# Patient Record
Sex: Female | Born: 2005 | Race: White | Hispanic: No | Marital: Single | Smoking: Never smoker
Health system: Southern US, Community
[De-identification: ages and names within clinical notes are randomized; demographics above are authoritative.]

---
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
## 2023-01-05 ENCOUNTER — Encounter: Payer: Self-pay | Admitting: Emergency Medicine

## 2023-01-05 ENCOUNTER — Ambulatory Visit
Admission: EM | Admit: 2023-01-05 | Discharge: 2023-01-05 | Disposition: A | Discharge status: Home / Self Care | Payer: Medicaid Other | Attending: Nurse Practitioner | Admitting: Nurse Practitioner

## 2023-01-05 DIAGNOSIS — R21 Rash and other nonspecific skin eruption: Secondary | ICD-10-CM | POA: Diagnosis not present

## 2023-01-05 MED ORDER — TRIAMCINOLONE ACETONIDE 0.1 % EX CREA: 1.0000 | TOPICAL_CREAM | Freq: Two times a day (BID) | CUTANEOUS | 0 refills | Status: DC

## 2023-01-05 MED ORDER — DEXAMETHASONE SODIUM PHOSPHATE 10 MG/ML IJ SOLN
10.0000 mg | INTRAMUSCULAR | Status: AC
  Administered 2023-01-05: 10 mg via INTRAMUSCULAR

## 2023-01-05 MED ORDER — PREDNISONE 20 MG PO TABS: 40.0000 mg | ORAL_TABLET | Freq: Every day | ORAL | 0 refills | Status: AC

## 2023-01-05 NOTE — ED Triage Notes (Signed)
Rash on upper lip and chest since June 29th.  Now has areas on legs, back and fingers. States areas itch.  Has tried hydrocortisone cream with little relief.

## 2023-01-05 NOTE — Discharge Instructions (Addendum)
You were given an injection of Decadron 10 mg today. Take medication as prescribed.  Begin taking the prednisone on 01/06/2023. May take Zyrtec during the daytime and Benadryl at bedtime to help with itching. Avoid hot baths or showers while symptoms persist.  Recommend taking lukewarm baths. May apply cool cloths to the area to help with itching or discomfort. Avoid scratching, rubbing, or manipulating the areas while symptoms persist. Recommend Aveeno Colloidal Oatmeal bath to use to help with drying and itching. Follow up if symptoms do not improve.

## 2023-01-05 NOTE — ED Provider Notes (Signed)
[facility]    CSN: [id number] Arrival date & time: 01/05/23  1344      History   Chief Complaint No chief complaint on file.   HPI Whitney Cross is a 17 y.o. female.   HPI  History reviewed. No pertinent past medical history.  Patient Active Problem List   Diagnosis Date Noted   Acne vulgaris 10/21/2021   Orthostatic hypotension 07/14/2021   Seborrheic dermatitis of scalp 07/14/2021    History reviewed. No pertinent surgical history.  OB History   No obstetric history on file.      Home Medications    Prior to Admission medications   Medication Sig Start Date End Date Taking? Authorizing Provider  predniSONE (DELTASONE) 20 MG tablet Take 2 tablets (40 mg total) by mouth daily with breakfast for 5 days. 01/05/23 01/10/23 Yes Alizey Noren-Warren, Sadie Haber, NP  triamcinolone cream (KENALOG) 0.1 % Apply 1 Application topically 2 (two) times daily. 01/05/23  Yes Catelin Manthe-Warren, Sadie Haber, NP  benzoyl peroxide-erythromycin (BENZAMYCIN) gel Apply topically at bedtime. 07/05/22   Tommie Sams, DO  levonorgestrel-ethinyl estradiol (VIENVA) 0.1-20 MG-MCG tablet Take 1 tablet by mouth daily. 11/30/22   Tommie Sams, DO    Family History History reviewed. No pertinent family history.  Social History Social History   Tobacco Use   Smoking status: Never    Passive exposure: Yes   Smokeless tobacco: Never  Vaping Use   Vaping Use: Never used  Substance Use Topics   Alcohol use: Never   Drug use: Never     Allergies   Amoxil [amoxicillin]   Review of Systems Review of Systems   Physical Exam Triage Vital Signs ED Triage Vitals  Enc Vitals Group     BP 01/05/23 1348 125/71     Pulse Rate 01/05/23 1348 83     Resp 01/05/23 1348 18     Temp 01/05/23 1348 99 F (37.2 C)     Temp Source 01/05/23 1348 Oral     SpO2 01/05/23 1348 98 %     Weight --      Height --      Head Circumference --      Peak Flow --      Pain Score 01/05/23 1349 0     Pain Loc  --      Pain Edu? --      Excl. in GC? --    No data found.  Updated Vital Signs BP 125/71 (BP Location: Right Arm)   Pulse 83   Temp 99 F (37.2 C) (Oral)   Resp 18   LMP 12/25/2022 (Exact Date)   SpO2 98%   Visual Acuity Right Eye Distance:   Left Eye Distance:   Bilateral Distance:    Right Eye Near:   Left Eye Near:    Bilateral Near:     Physical Exam Vitals and nursing note reviewed.  Constitutional:      General: She is not in acute distress.    Appearance: Normal appearance.  HENT:     Head: Normocephalic.  Eyes:     Extraocular Movements: Extraocular movements intact.     Pupils: Pupils are equal, round, and reactive to light.  Pulmonary:     Effort: Pulmonary effort is normal.  Musculoskeletal:     Cervical back: Normal range of motion.  Skin:    General: Skin is warm and dry.     Findings: Rash present. Rash is macular and papular.  Comments: Maculopapular rash noted to the mid and lower back, to the anterior right thigh, over the top lip, neck and chest.  Rash is maculopapular in nature, and erythematous.  There is no congruent pattern.  There is no oozing, fluctuance, or drainage present.  Neurological:     General: No focal deficit present.     Mental Status: She is alert and oriented to person, place, and time.  Psychiatric:        Mood and Affect: Mood normal.        Behavior: Behavior normal.      UC Treatments / Results  Labs (all labs ordered are listed, but only abnormal results are displayed) Labs Reviewed - No data to display  EKG   Radiology No results found.  Procedures Procedures (including critical care time)  Medications Ordered in UC Medications  dexamethasone (DECADRON) injection 10 mg (10 mg Intramuscular Given 01/05/23 1411)    Initial Impression / Assessment and Plan / UC Course  I have reviewed the triage vital signs and the nursing notes.  Pertinent labs & imaging results that were available during my care of  the patient were reviewed by me and considered in my medical decision making (see chart for details).  The patient is well-appearing, she is in no acute distress, vital signs are stable.  Patient with rash in multiple areas.  Difficult to determine the cause of the rash at this time.  Will treat empirically with Decadron 10 mg IM given in the clinic.  Prednisone 40 mg for the next 5 days was prescribed along with triamcinolone cream 0.1% to apply topically.  Supportive care recommendations were provided and discussed with the patient to include avoidance of rubbing or scratching the rash, over-the-counter antihistamine such as Benadryl or Zyrtec, and use of Aveeno colloidal oatmeal bath to help with itching and drying of the rash.  Patient was advised that if symptoms do not improve with this treatment, recommend that she follow-up in this clinic or with her primary care physician for further evaluation.  Patient was in agreement with this plan of care and verbalized understanding.  All questions were answered.  Patient is stable for discharge.  Final Clinical Impressions(s) / UC Diagnoses   Final diagnoses:  Rash and nonspecific skin eruption     Discharge Instructions      You were given an injection of Decadron 10 mg today. Take medication as prescribed.  Begin taking the prednisone on 01/06/2023. May take Zyrtec during the daytime and Benadryl at bedtime to help with itching. Avoid hot baths or showers while symptoms persist.  Recommend taking lukewarm baths. May apply cool cloths to the area to help with itching or discomfort. Avoid scratching, rubbing, or manipulating the areas while symptoms persist. Recommend Aveeno Colloidal Oatmeal bath to use to help with drying and itching. Follow up if symptoms do not improve.      ED Prescriptions     Medication Sig Dispense Auth. Provider   predniSONE (DELTASONE) 20 MG tablet Take 2 tablets (40 mg total) by mouth daily with breakfast for 5  days. 10 tablet Cali Cuartas-Warren, Sadie Haber, NP   triamcinolone cream (KENALOG) 0.1 % Apply 1 Application topically 2 (two) times daily. 30 g Collie Wernick-Warren, Sadie Haber, NP      PDMP not reviewed this encounter.   Abran Cantor, NP 01/05/23 1416

## 2023-02-01 DIAGNOSIS — L700 Acne vulgaris: Secondary | ICD-10-CM | POA: Diagnosis not present

## 2023-12-20 ENCOUNTER — Ambulatory Visit
Admission: EM | Admit: 2023-12-20 | Discharge: 2023-12-20 | Disposition: A | Discharge status: Home / Self Care | Attending: Nurse Practitioner | Admitting: Nurse Practitioner

## 2023-12-20 ENCOUNTER — Encounter: Payer: Self-pay | Admitting: Emergency Medicine

## 2023-12-20 ENCOUNTER — Other Ambulatory Visit: Payer: Self-pay

## 2023-12-20 DIAGNOSIS — L232 Allergic contact dermatitis due to cosmetics: Secondary | ICD-10-CM

## 2023-12-20 MED ORDER — TRIAMCINOLONE ACETONIDE 0.1 % EX CREA: 1.0000 | TOPICAL_CREAM | Freq: Two times a day (BID) | CUTANEOUS | 0 refills | Status: DC

## 2023-12-20 NOTE — Discharge Instructions (Addendum)
 Please stop using the scented deodorant and start using an aluminum free nonscented deodorant.  In addition, recommend cleaning your skin twice daily with soap and water and applying a very small amount of the triamcinolone  cream to your underarm area twice daily to help the rash cleared up.  Seek care if symptoms do not improve with treatment.

## 2023-12-20 NOTE — ED Provider Notes (Signed)
 [facility]    CSN: [id number] Arrival date & time: 12/20/23  1431      History   Chief Complaint Chief Complaint  Patient presents with   Skin Problem    HPI Whitney Cross is a 18 y.o. female.   Patient presents today for 2 week history of raised, red, itchy bumps to bilateral axilla.  She denies any oozing from the bumps, scaling, or blisters.  No fever or recent change in any detergents, soaps, personal care products.  Reports she uses secret cherry Blossom deodorant and this has not changed.  No shortness of breath or throat/tongue swelling.  Has tried hydrocortisone  cream without relief.    History reviewed. No pertinent past medical history.  Patient Active Problem List   Diagnosis Date Noted   Acne vulgaris 10/21/2021   Orthostatic hypotension 07/14/2021   Seborrheic dermatitis of scalp 07/14/2021    History reviewed. No pertinent surgical history.  OB History   No obstetric history on file.      Home Medications    Prior to Admission medications   Medication Sig Start Date End Date Taking? Authorizing Provider  triamcinolone  cream (KENALOG ) 0.1 % Apply 1 Application topically 2 (two) times daily. Apply pea sized amount to each underarm twice daily to help with itching and rash.  Do not use for more than 14 days in a row. 12/20/23  Yes Wilhemena Harbour, NP  benzoyl peroxide -erythromycin  (BENZAMYCIN) gel Apply topically at bedtime. 07/05/22   Cook, Jayce G, DO  levonorgestrel -ethinyl estradiol (VIENVA) 0.1-20 MG-MCG tablet Take 1 tablet by mouth daily. 11/30/22   Cook, Jayce G, DO    Family History History reviewed. No pertinent family history.  Social History Social History   Tobacco Use   Smoking status: Never    Passive exposure: Yes   Smokeless tobacco: Never  Vaping Use   Vaping status: Never Used  Substance Use Topics   Alcohol use: Never   Drug use: Never     Allergies   Amoxil [amoxicillin]   Review of Systems Review of  Systems Per HPI  Physical Exam Triage Vital Signs ED Triage Vitals  Encounter Vitals Group     BP 12/20/23 1448 105/69     Girls Systolic BP Percentile --      Girls Diastolic BP Percentile --      Boys Systolic BP Percentile --      Boys Diastolic BP Percentile --      Pulse Rate 12/20/23 1448 81     Resp 12/20/23 1448 20     Temp 12/20/23 1448 98.7 F (37.1 C)     Temp Source 12/20/23 1448 Oral     SpO2 12/20/23 1448 95 %     Weight --      Height --      Head Circumference --      Peak Flow --      Pain Score 12/20/23 1447 5     Pain Loc --      Pain Education --      Exclude from Growth Chart --    No data found.  Updated Vital Signs BP 105/69 (BP Location: Right Arm)   Pulse 81   Temp 98.7 F (37.1 C) (Oral)   Resp 20   LMP 11/21/2023 (Approximate)   SpO2 95%   Visual Acuity Right Eye Distance:   Left Eye Distance:   Bilateral Distance:    Right Eye Near:   Left Eye Near:  Bilateral Near:     Physical Exam Vitals and nursing note reviewed.  Constitutional:      General: She is not in acute distress.    Appearance: Normal appearance. She is not toxic-appearing.  HENT:     Head: Normocephalic and atraumatic.     Right Ear: External ear normal.     Left Ear: External ear normal.     Mouth/Throat:     Mouth: Mucous membranes are moist.     Pharynx: Oropharynx is clear.  Pulmonary:     Effort: Pulmonary effort is normal. No respiratory distress.   Musculoskeletal:     Cervical back: Normal range of motion.  Lymphadenopathy:     Cervical: No cervical adenopathy.   Skin:    General: Skin is warm and dry.     Capillary Refill: Capillary refill takes less than 2 seconds.     Findings: Rash present.     Comments: Raised, erythematous papular and nodular lesions noted to bilateral axilla.  No active drainage or oozing.  No fluctuance, warmth, odor.   Neurological:     Mental Status: She is alert and oriented to person, place, and time.    Psychiatric:        Behavior: Behavior is cooperative.      UC Treatments / Results  Labs (all labs ordered are listed, but only abnormal results are displayed) Labs Reviewed - No data to display  EKG   Radiology No results found.  Procedures Procedures (including critical care time)  Medications Ordered in UC Medications - No data to display  Initial Impression / Assessment and Plan / UC Course  I have reviewed the triage vital signs and the nursing notes.  Pertinent labs & imaging results that were available during my care of the patient were reviewed by me and considered in my medical decision making (see chart for details).   Patient is well-appearing, normotensive, afebrile, not tachycardic, not tachypneic, oxygenating well on room air.   1. Allergic contact dermatitis due to cosmetics Suspect contact dermatitis due to deodorant Discontinue use of scented deodorant and start triamcinolone  cream Return for persistent/worsening symptoms despite treatment  The patient was given the opportunity to ask questions.  All questions answered to their satisfaction.  The patient is in agreement to this plan.   Final Clinical Impressions(s) / UC Diagnoses   Final diagnoses:  Allergic contact dermatitis due to cosmetics     Discharge Instructions      Please stop using the scented deodorant and start using an aluminum free nonscented deodorant.  In addition, recommend cleaning your skin twice daily with soap and water and applying a very small amount of the triamcinolone  cream to your underarm area twice daily to help the rash cleared up.  Seek care if symptoms do not improve with treatment.   ED Prescriptions     Medication Sig Dispense Auth. Provider   triamcinolone  cream (KENALOG ) 0.1 % Apply 1 Application topically 2 (two) times daily. Apply pea sized amount to each underarm twice daily to help with itching and rash.  Do not use for more than 14 days in a row. 15 g  Wilhemena Harbour, NP      PDMP not reviewed this encounter.   Wilhemena Harbour, NP 12/20/23 [phone]

## 2023-12-20 NOTE — ED Triage Notes (Signed)
 Pt reports bumps underarms x2 weeks. Pt reports has not shaved since bumps started. Reports burning sensation in bilateral axilla.

## 2023-12-24 DIAGNOSIS — L700 Acne vulgaris: Secondary | ICD-10-CM | POA: Diagnosis not present

## 2023-12-24 DIAGNOSIS — Z0000 Encounter for general adult medical examination without abnormal findings: Secondary | ICD-10-CM | POA: Diagnosis not present

## 2024-03-25 ENCOUNTER — Ambulatory Visit
Admission: EM | Admit: 2024-03-25 | Discharge: 2024-03-25 | Disposition: A | Discharge status: Home / Self Care | Attending: Family Medicine | Admitting: Family Medicine

## 2024-03-25 ENCOUNTER — Encounter: Payer: Self-pay | Admitting: Emergency Medicine

## 2024-03-25 DIAGNOSIS — S1096XA Insect bite of unspecified part of neck, initial encounter: Secondary | ICD-10-CM

## 2024-03-25 DIAGNOSIS — W57XXXA Bitten or stung by nonvenomous insect and other nonvenomous arthropods, initial encounter: Secondary | ICD-10-CM

## 2024-03-25 DIAGNOSIS — L989 Disorder of the skin and subcutaneous tissue, unspecified: Secondary | ICD-10-CM

## 2024-03-25 MED ORDER — TRIAMCINOLONE ACETONIDE 0.1 % EX CREA: 1.0000 | TOPICAL_CREAM | Freq: Two times a day (BID) | CUTANEOUS | 0 refills | Status: AC

## 2024-03-25 NOTE — Discharge Instructions (Signed)
 Keep the area clean, may apply the steroid cream twice daily as needed to help with itching and inflammation.  You may also take Zyrtec daily to help with any allergic response from the bite.  This does not appear to be infected at this time and should resolve without difficulty but follow-up for worsening or unresolving symptoms

## 2024-03-25 NOTE — ED Triage Notes (Signed)
 Red area on back of neck since Monday a week ago.  States area bleeds from time to time and is tender to the touch.  Also has a knot above area at hair line.

## 2024-03-25 NOTE — ED Provider Notes (Signed)
 [facility]    CSN: [id number] Arrival date & time: 03/25/24  1017      History   Chief Complaint No chief complaint on file.   HPI [name] is a 18 y.o. female.   Patient presenting today with an insect bite to the back of neck on the right side that occurred 1 week ago.  Denies fever, chills, drainage, bleeding, nausea, vomiting.  States the area bleeds from time to time and is tender to the touch and she is concerned that a spider might have bitten her.  So far not trying anything over-the-counter for symptoms.    History reviewed. No pertinent past medical history.  Patient Active Problem List   Diagnosis Date Noted   Acne vulgaris 10/21/2021   Orthostatic hypotension 07/14/2021   Seborrheic dermatitis of scalp 07/14/2021    History reviewed. No pertinent surgical history.  OB History   No obstetric history on file.      Home Medications    Prior to Admission medications   Medication Sig Start Date End Date Taking? Authorizing Provider  triamcinolone  cream (KENALOG ) 0.1 % Apply 1 Application topically 2 (two) times daily. 03/25/24  Yes Stuart Vernell Norris, PA-C  benzoyl peroxide -erythromycin  (BENZAMYCIN) gel Apply topically at bedtime. 07/05/22   Cook, Jayce G, DO  levonorgestrel -ethinyl estradiol (VIENVA) 0.1-20 MG-MCG tablet Take 1 tablet by mouth daily. 11/30/22   Cook, Jayce G, DO    Family History History reviewed. No pertinent family history.  Social History Social History   Tobacco Use   Smoking status: Never    Passive exposure: Yes   Smokeless tobacco: Never  Vaping Use   Vaping status: Never Used  Substance Use Topics   Alcohol use: Never   Drug use: Never     Allergies   Amoxil [amoxicillin]   Review of Systems Review of Systems PER HPI  Physical Exam Triage Vital Signs ED Triage Vitals  Encounter Vitals Group     BP 03/25/24 1022 110/70     Girls Systolic BP Percentile --      Girls Diastolic BP Percentile  --      Boys Systolic BP Percentile --      Boys Diastolic BP Percentile --      Pulse Rate 03/25/24 1022 88     Resp 03/25/24 1022 18     Temp 03/25/24 1022 98.8 F (37.1 C)     Temp Source 03/25/24 1022 Oral     SpO2 03/25/24 1022 98 %     Weight --      Height --      Head Circumference --      Peak Flow --      Pain Score 03/25/24 1025 0     Pain Loc --      Pain Education --      Exclude from Growth Chart --    No data found.  Updated Vital Signs BP 110/70 (BP Location: Right Arm)   Pulse 88   Temp 98.8 F (37.1 C) (Oral)   Resp 18   LMP 03/16/2024 (Approximate)   SpO2 98%   Visual Acuity Right Eye Distance:   Left Eye Distance:   Bilateral Distance:    Right Eye Near:   Left Eye Near:    Bilateral Near:     Physical Exam Vitals and nursing note reviewed.  Constitutional:      Appearance: Normal appearance. She is not ill-appearing.  HENT:     Head:  Atraumatic.  Eyes:     Extraocular Movements: Extraocular movements intact.     Conjunctiva/sclera: Conjunctivae normal.  Cardiovascular:     Rate and Rhythm: Normal rate.  Pulmonary:     Effort: Pulmonary effort is normal.  Musculoskeletal:        General: Normal range of motion.     Cervical back: Normal range of motion and neck supple.  Skin:    General: Skin is warm and dry.     Findings: Erythema present.     Comments: Trace erythema, dryness to the bite site.  No fluctuance, induration, tenderness to palpation, bleeding or drainage  Neurological:     Mental Status: She is alert and oriented to person, place, and time.  Psychiatric:        Mood and Affect: Mood normal.        Thought Content: Thought content normal.        Judgment: Judgment normal.      UC Treatments / Results  Labs (all labs ordered are listed, but only abnormal results are displayed) Labs Reviewed - No data to display  EKG   Radiology No results found.  Procedures Procedures (including critical care  time)  Medications Ordered in UC Medications - No data to display  Initial Impression / Assessment and Plan / UC Course  I have reviewed the triage vital signs and the nursing notes.  Pertinent labs & imaging results that were available during my care of the patient were reviewed by me and considered in my medical decision making (see chart for details).     Mild allergic site reaction from insect bite.  No evidence of bacterial infection or other concerning features at this time.  Treat with triamcinolone , Zyrtec, keeping area clean.  Return for worsening symptoms.  Final Clinical Impressions(s) / UC Diagnoses   Final diagnoses:  Skin lesion  Insect bite of neck, initial encounter     Discharge Instructions      Keep the area clean, may apply the steroid cream twice daily as needed to help with itching and inflammation.  You may also take Zyrtec daily to help with any allergic response from the bite.  This does not appear to be infected at this time and should resolve without difficulty but follow-up for worsening or unresolving symptoms     ED Prescriptions     Medication Sig Dispense Auth. Provider   triamcinolone  cream (KENALOG ) 0.1 % Apply 1 Application topically 2 (two) times daily. 80 g Stuart Vernell Norris, [place]      PDMP not reviewed this encounter.   Stuart Vernell Norris, PA-C 03/25/24 1115
